# Patient Record
Sex: Male | Born: 1965 | Race: White | Hispanic: No | Marital: Married
Health system: Southern US, Community
[De-identification: ages and names within clinical notes are randomized; demographics above are authoritative.]

---

## 2006-12-31 ENCOUNTER — Ambulatory Visit (HOSPITAL_COMMUNITY): Admission: RE | Admit: 2006-12-31 | Discharge: 2006-12-31 | Payer: Self-pay | Admitting: Internal Medicine

## 2007-02-22 ENCOUNTER — Ambulatory Visit: Payer: Self-pay | Admitting: Gastroenterology

## 2007-02-24 ENCOUNTER — Ambulatory Visit (HOSPITAL_COMMUNITY): Admission: RE | Admit: 2007-02-24 | Discharge: 2007-02-24 | Payer: Self-pay | Admitting: Gastroenterology

## 2007-04-17 ENCOUNTER — Emergency Department (HOSPITAL_COMMUNITY): Admission: EM | Admit: 2007-04-17 | Discharge: 2007-04-17 | Payer: Self-pay | Admitting: Emergency Medicine

## 2008-05-17 ENCOUNTER — Ambulatory Visit (HOSPITAL_COMMUNITY): Admission: RE | Admit: 2008-05-17 | Discharge: 2008-05-17 | Payer: Self-pay | Admitting: Internal Medicine

## 2008-06-01 ENCOUNTER — Ambulatory Visit: Admission: RE | Admit: 2008-06-01 | Discharge: 2008-06-01 | Payer: Self-pay | Admitting: Internal Medicine

## 2009-03-19 IMAGING — CR DG CHEST 2V
2 series · 2 of 2 positions shown · non-contrast
Comparison: None

CLINICAL DATA: Shortness of breath dyspnea exertion for 3 months.

CHEST - 2 VIEW

[view not recorded (1 of 2)]
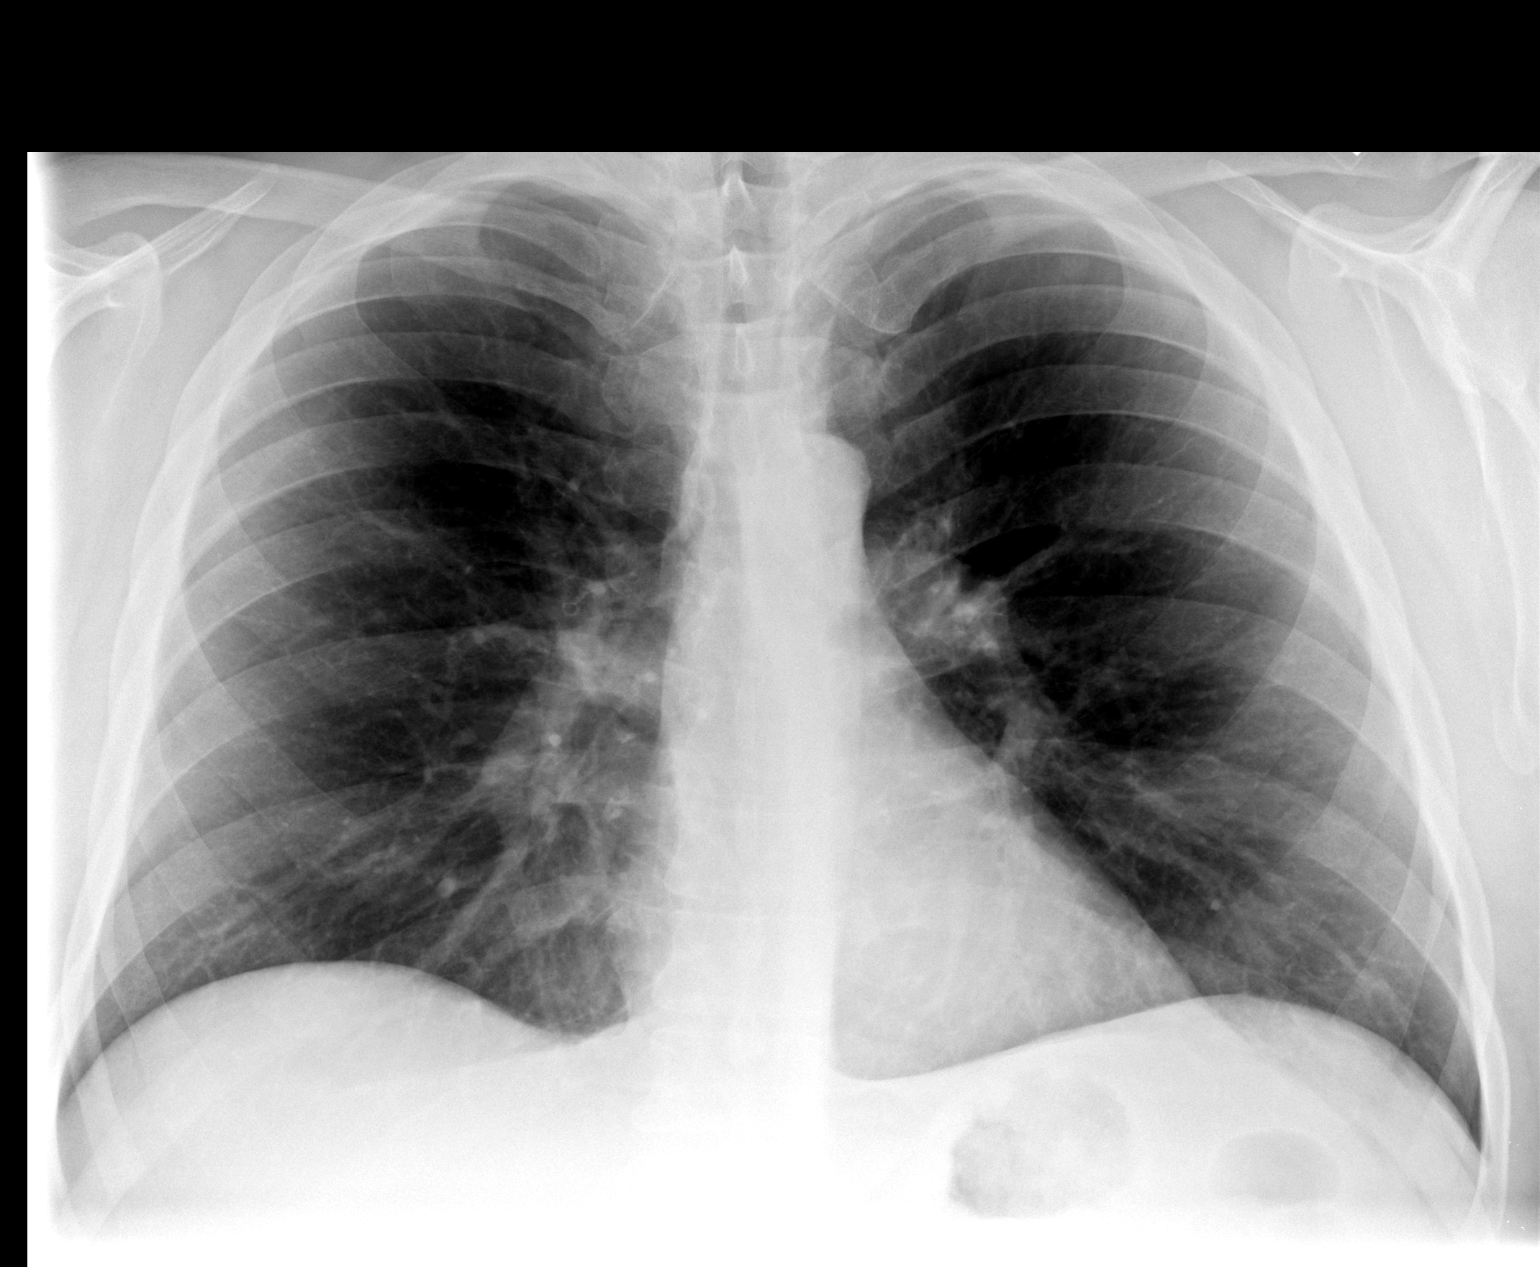

[view not recorded (2 of 2)]
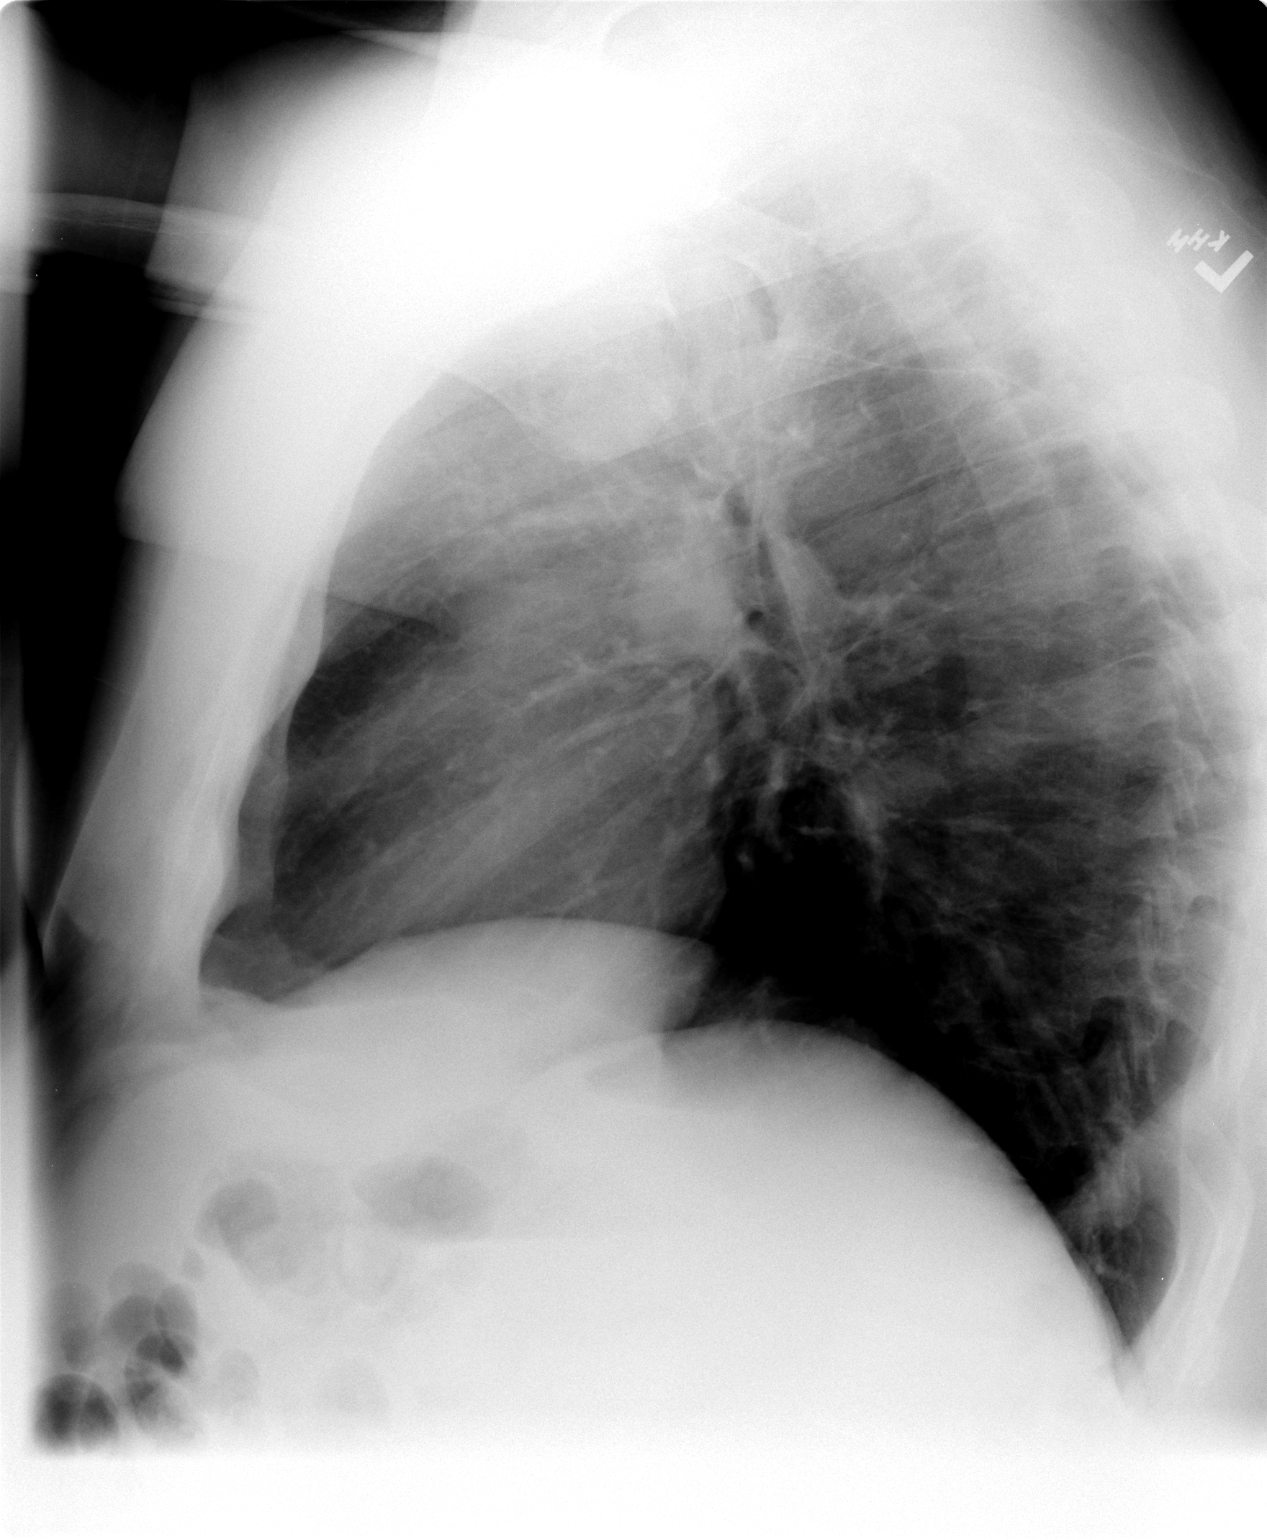

[2 of 2 positions shown; findings below may reference images not displayed]

FINDINGS: Midline trachea. Normal heart size and mediastinal contours. Sharp
costophrenic angles. No pneumothorax or congestive failure. Clear lungs.

IMPRESSION

No acute cardiopulmonary disease.

## 2010-12-02 NOTE — Assessment & Plan Note (Signed)
NAMEMarland Kitchen  Joseph Santos, Joseph Santos              CHART#:  [id number]   DATE:  02/22/2007                       DOB:  03-23-1966   REASON FOR CONSULTATION:  Elevated liver function tests.   PHYSICIAN REQUESTING CONSULTATION:  Catalina Pizza, M.D.   HISTORY OF PRESENT ILLNESS:  Joseph Santos is a 45 year old Caucasian  gentleman who is here for further evaluation of elevated LFTs.  The  patient states he recently became aware of this finding through blood  work with Dr. Catalina Pizza.  His last labs were from January 24, 2007 and his  AST was 53, ALT 105 (previously had been 43 and 69 respectively).  Hepatitis B surface antigen, hepatitis A antibody, IGM and hepatitis C  antibody were all negative.  Platelet count was normal at 223,000,  hemoglobin 15. 6.  Albumin 4.1. TSH 1.026.  Mono screen negative.  The  patient states he has been very fatigued, having problems with insomnia  as well.  He admits to both mental and physical stress.  He is very  anxious.  He has a lot of stress at home.  He says approximately once a  week he has episodes of vomiting which are triggered by stress.  He  states this has been occurring since he moved here from [place] in  March of this year.  He has occasional heartburn but does not take any  medication for this.  Denies any dysphagia or odynophagia.  He states he  has gained about 20 pounds in the last 6 months.  Bowel movements are  regular.  He denies any constipation.  No melena.  He has occasional  bright red blood per rectum, usually once every other month.  He has  never had a colonoscopy.  Denies any abdominal pain.   CURRENT MEDICATIONS:  None.  Denies any herbal medications.   ALLERGIES:  PENICILLIN.   PAST MEDICAL HISTORY:  Negative for chronic illnesses.   PAST SURGICAL HISTORY:  Tonsillectomy.   FAMILY HISTORY:  His father died of possible bone cancer.   SOCIAL HISTORY:  Single. He has step-daughters.  He is employed.  He  occasionally consumes beer, usually  once a month.  Patient denies any IV  or intranasal drug use, tattoos, blood transfusions.   REVIEW OF SYSTEMS:  See HPI for GI.  CARDIOPULMONARY:  Negative for  chest pain, shortness of breath, palpitations.   PHYSICAL EXAMINATION:  VITAL SIGNS:  Weight 278.5 pounds, height 6 feet,  1 inch.  Temperature 98.2, blood pressure 120/88, pulse 72.GENERAL:  A  pleasant well-developed, well-nourished Caucasian male in no acute  distress.SKIN:  Warm and dry, no jaundice.HEENT:  Sclerae non icteric.  Oral pharyngeal mucosa moist and pink.NECK:  No lymphadenopathy,  thyromegaly.CHEST:  Lungs clear to auscultation.CARDIAC EXAM:  Reveals a  regular rate and rhythm, normal S1, S2, no murmurs, rubs, or gallops.  ABDOMEN:  Positive bowel sounds. Abdomen soft, nontender, nondistended,  no organomegaly or masses, no rebound tenderness, no guarding, no  abdominal bruits or hernias.EXTREMITIES:  No edema.   IMPRESSION:  Joseph Santos is a 45 year old gentleman with mildly elevated  transaminases.  This may very well be due to steatohepatitis.  He has  had a significant weight gain over the last 6 months.  Will rule out  other etiologies including hemachromatosis.  Viral markers were recently  negative.  He also has intermittent small volume hematochezia for which  I have offered him a colonoscopy but he would like to postpone for now.   PLAN:  1. Abdominal ultrasound in the near future.  2. Liver function tests, iron and TIBC, ferritin.  3. Encouraged aerobic exercise x3 weekly, at least 30 minutes and slow      gradual weight loss of 20 pounds over the next 3 months.  4. Further recommendations to follow.  5. Return visit in 3 months.   I would like to thank Dr. Catalina Pizza for allowing [place] to take part in the  care of this patient.       Tana Coast, P.A.  Electronically Signed     Kassie Mends, M.D.  Electronically Signed    LL/MEDQ  D:  02/22/2007  T:  02/22/2007  Job:  [id number]   cc:   Catalina Pizza, M.D.

## 2010-12-02 NOTE — Procedures (Signed)
NAMEJERIMYAH, VANDUNK NO.:  [id number]   MEDICAL RECORD NO.:  [id number]           PATIENT TYPE:   LOCATION:                                 FACILITY:   PHYSICIAN:  Edward L. Juanetta Gosling, M.D.DATE OF BIRTH:  [birth year]   DATE OF PROCEDURE:  DATE OF DISCHARGE:                            PULMONARY FUNCTION TEST   1. Spirometry shows no ventilatory defect, but some evidence of      airflow obstruction.  2. There is no significant bronchodilator improvement but his baseline      FEV-1 and FVC are both higher than predicted anyway.      Edward L. Juanetta Gosling, M.D.  Electronically Signed     ELH/MEDQ  D:  05/19/2008  T:  05/19/2008  Job:  [id number]   cc:   Catalina Pizza, M.D.  Fax: [phone]

## 2010-12-05 NOTE — Procedures (Signed)
Joseph Santos, Joseph Santos             ACCOUNT NO.:  [id number]   MEDICAL RECORD NO.:  [id number]          PATIENT TYPE:  OUT   LOCATION:  SLEE                          FACILITY:  [facility]   PHYSICIAN:  Kofi A. Gerilyn Pilgrim, M.D. DATE OF BIRTH:  September 28, 1965   DATE OF PROCEDURE:  DATE OF DISCHARGE:                             SLEEP DISORDER REPORT   REFERRING PHYSICIAN:  Catalina Pizza, MD   RECORDING DATE:  June 01, 2008.   INDICATIONS:  A 45 year old man who presents with snoring, hypersomnia,  and is being evaluated for obstructive sleep apnea syndrome.   MEDICATIONS:  Xanax and Lunesta.   ARCHITECTURAL SUMMARY:  The total recording time is 458 minutes.  The  sleep efficiency is 78%, sleep latency 12 minutes, and REM latency 124  minutes.  Stage N1 5%, N2 41%, N3 30%, and REM sleep is 24%.   RESPIRATORY SUMMARY:  Baseline oxygen saturation 94% and lowest  saturation 79% during the REM sleep.  AHI is 6.  The REM AHI, however,  is 20 with almost all events occurring during REM sleep.   LIMB MOVEMENT SUMMARY:  PLM index is 0.   ELECTROCARDIOGRAM SUMMARY:  Average heart rate of 69 with no significant  dysrhythmias observed.   IMPRESSION:  Mild REM related sleep apnea not requiring positive  pressure.  However, the patient is noted to have marked sleepiness with  an Epworth sleepiness scale of 18.  I would recommend sleep consultation  for further evaluation and treatment.      Kofi A. Gerilyn Pilgrim, M.D.  Electronically Signed     KAD/MEDQ  D:  06/02/2008  T:  06/02/2008  Job:  [id number]

## 2012-08-05 ENCOUNTER — Institutional Professional Consult (permissible substitution): Payer: Self-pay | Admitting: Emergency Medicine

## 2013-08-07 ENCOUNTER — Other Ambulatory Visit (HOSPITAL_COMMUNITY): Payer: Self-pay

## 2013-08-07 DIAGNOSIS — J441 Chronic obstructive pulmonary disease with (acute) exacerbation: Secondary | ICD-10-CM

## 2013-08-15 ENCOUNTER — Ambulatory Visit (HOSPITAL_COMMUNITY)
Admission: RE | Admit: 2013-08-15 | Discharge: 2013-08-15 | Disposition: A | Discharge status: Home / Self Care | Payer: BC Managed Care – PPO | Source: Ambulatory Visit | Attending: Internal Medicine | Admitting: Internal Medicine

## 2013-08-15 DIAGNOSIS — J4489 Other specified chronic obstructive pulmonary disease: Secondary | ICD-10-CM | POA: Insufficient documentation

## 2013-08-15 DIAGNOSIS — J449 Chronic obstructive pulmonary disease, unspecified: Secondary | ICD-10-CM | POA: Insufficient documentation

## 2013-08-15 DIAGNOSIS — R0989 Other specified symptoms and signs involving the circulatory and respiratory systems: Principal | ICD-10-CM | POA: Insufficient documentation

## 2013-08-15 DIAGNOSIS — R0609 Other forms of dyspnea: Secondary | ICD-10-CM | POA: Insufficient documentation

## 2013-08-15 MED ORDER — ALBUTEROL SULFATE (2.5 MG/3ML) 0.083% IN NEBU
2.5000 mg | INHALATION_SOLUTION | Freq: Once | RESPIRATORY_TRACT | Status: AC
  Administered 2013-08-15: 2.5 mg via RESPIRATORY_TRACT

## 2013-08-16 LAB — PULMONARY FUNCTION TEST
DL/VA % PRED: 119 %
DL/VA: 5.64 ml/min/mmHg/L
DLCO COR % PRED: 96 %
DLCO cor: 32.36 ml/min/mmHg
DLCO unc % pred: 96 %
DLCO unc: 32.36 ml/min/mmHg
FEF 25-75 Post: 2.74 L/sec
FEF 25-75 Pre: 3.22 L/sec
FEF2575-%CHANGE-POST: -14 %
FEF2575-%PRED-POST: 74 %
FEF2575-%PRED-PRE: 87 %
FEV1-%CHANGE-POST: -2 %
FEV1-%PRED-PRE: 102 %
FEV1-%Pred-Post: 100 %
FEV1-POST: 4.15 L
FEV1-Pre: 4.25 L
FEV1FVC-%CHANGE-POST: 4 %
FEV1FVC-%Pred-Pre: 94 %
FEV6-%Change-Post: -7 %
FEV6-%Pred-Post: 103 %
FEV6-%Pred-Pre: 111 %
FEV6-PRE: 5.73 L
FEV6-Post: 5.31 L
FEV6FVC-%Change-Post: 0 %
FEV6FVC-%PRED-PRE: 101 %
FEV6FVC-%Pred-Post: 102 %
FVC-%Change-Post: -6 %
FVC-%Pred-Post: 101 %
FVC-%Pred-Pre: 109 %
FVC-Post: 5.39 L
FVC-Pre: 5.79 L
POST FEV1/FVC RATIO: 77 %
POST FEV6/FVC RATIO: 100 %
Pre FEV1/FVC ratio: 73 %
Pre FEV6/FVC Ratio: 99 %
RV % pred: 125 %
RV: 2.56 L
TLC % PRED: 92 %
TLC: 6.63 L

## 2013-08-16 NOTE — Procedures (Signed)
[name]:  Joseph Santos, Joseph Santos             ACCOUNT NO.:  [id number]  MEDICAL RECORD NO.:  [id number]  LOCATION:  RESP                          FACILITY:  [facility]  PHYSICIAN:  Roneisha Stern L. Juanetta GoslingHawkins, M.D.DATE OF BIRTH:  1966-04-21  DATE OF PROCEDURE: DATE OF DISCHARGE:  08/15/2013                           PULMONARY FUNCTION TEST   REASON FOR PULMONARY FUNCTION TESTING:  COPD.  1. Spirometry is normal. 2. Lung volumes show air trapping. 3. DLCO is normal. 4. Airway resistance is mildly elevated suggesting airflow     obstruction. 5. There is no significant bronchodilator improvement. 6. This study is consistent with COPD which is on this pulmonary     function testing, now is relatively mild.     Kaitlyne Friedhoff L. Juanetta GoslingHawkins, M.D.     ELH/MEDQ  D:  08/15/2013  T:  08/16/2013  Job:  [id number]  cc:   [facility]
